# Patient Record
Sex: Male | Born: 1965 | Hispanic: No | Marital: Married | Smoking: Never smoker
Health system: Southern US, Community
[De-identification: ages and names within clinical notes are randomized; demographics above are authoritative.]

## PROBLEM LIST (undated history)

## (undated) DIAGNOSIS — E109 Type 1 diabetes mellitus without complications: Secondary | ICD-10-CM

## (undated) DIAGNOSIS — F32A Depression, unspecified: Secondary | ICD-10-CM

## (undated) DIAGNOSIS — E291 Testicular hypofunction: Secondary | ICD-10-CM

## (undated) DIAGNOSIS — E785 Hyperlipidemia, unspecified: Secondary | ICD-10-CM

## (undated) DIAGNOSIS — F1010 Alcohol abuse, uncomplicated: Secondary | ICD-10-CM

## (undated) DIAGNOSIS — A630 Anogenital (venereal) warts: Secondary | ICD-10-CM

## (undated) DIAGNOSIS — F329 Major depressive disorder, single episode, unspecified: Secondary | ICD-10-CM

## (undated) HISTORY — DX: Type 1 diabetes mellitus without complications: E10.9

## (undated) HISTORY — PX: TIBIA FRACTURE SURGERY: SHX806

## (undated) HISTORY — PX: APPENDECTOMY: SHX54

## (undated) HISTORY — PX: WISDOM TOOTH EXTRACTION: SHX21

## (undated) HISTORY — DX: Anogenital (venereal) warts: A63.0

## (undated) HISTORY — DX: Testicular hypofunction: E29.1

## (undated) HISTORY — DX: Depression, unspecified: F32.A

## (undated) HISTORY — DX: Major depressive disorder, single episode, unspecified: F32.9

## (undated) HISTORY — DX: Hyperlipidemia, unspecified: E78.5

## (undated) HISTORY — DX: Alcohol abuse, uncomplicated: F10.10

---

## 1990-02-04 DIAGNOSIS — A630 Anogenital (venereal) warts: Secondary | ICD-10-CM

## 1990-02-04 HISTORY — DX: Anogenital (venereal) warts: A63.0

## 2004-06-03 ENCOUNTER — Inpatient Hospital Stay: Payer: Self-pay | Admitting: Internal Medicine

## 2006-07-12 IMAGING — CR DG CHEST 1V PORT
1 series · 1 of 1 positions shown · non-contrast
Comparison: none

REASON FOR EXAM: Shortness of breath
COMMENTS:

PROCEDURE:     DXR - DXR PORTABLE CHEST SINGLE VIEW  - June 04, 2004  [DATE]
RESULT:     Single portable chest shows the lungs to be clear and well
expanded without evidence of infiltrates, effusions or mass.

[view not recorded]
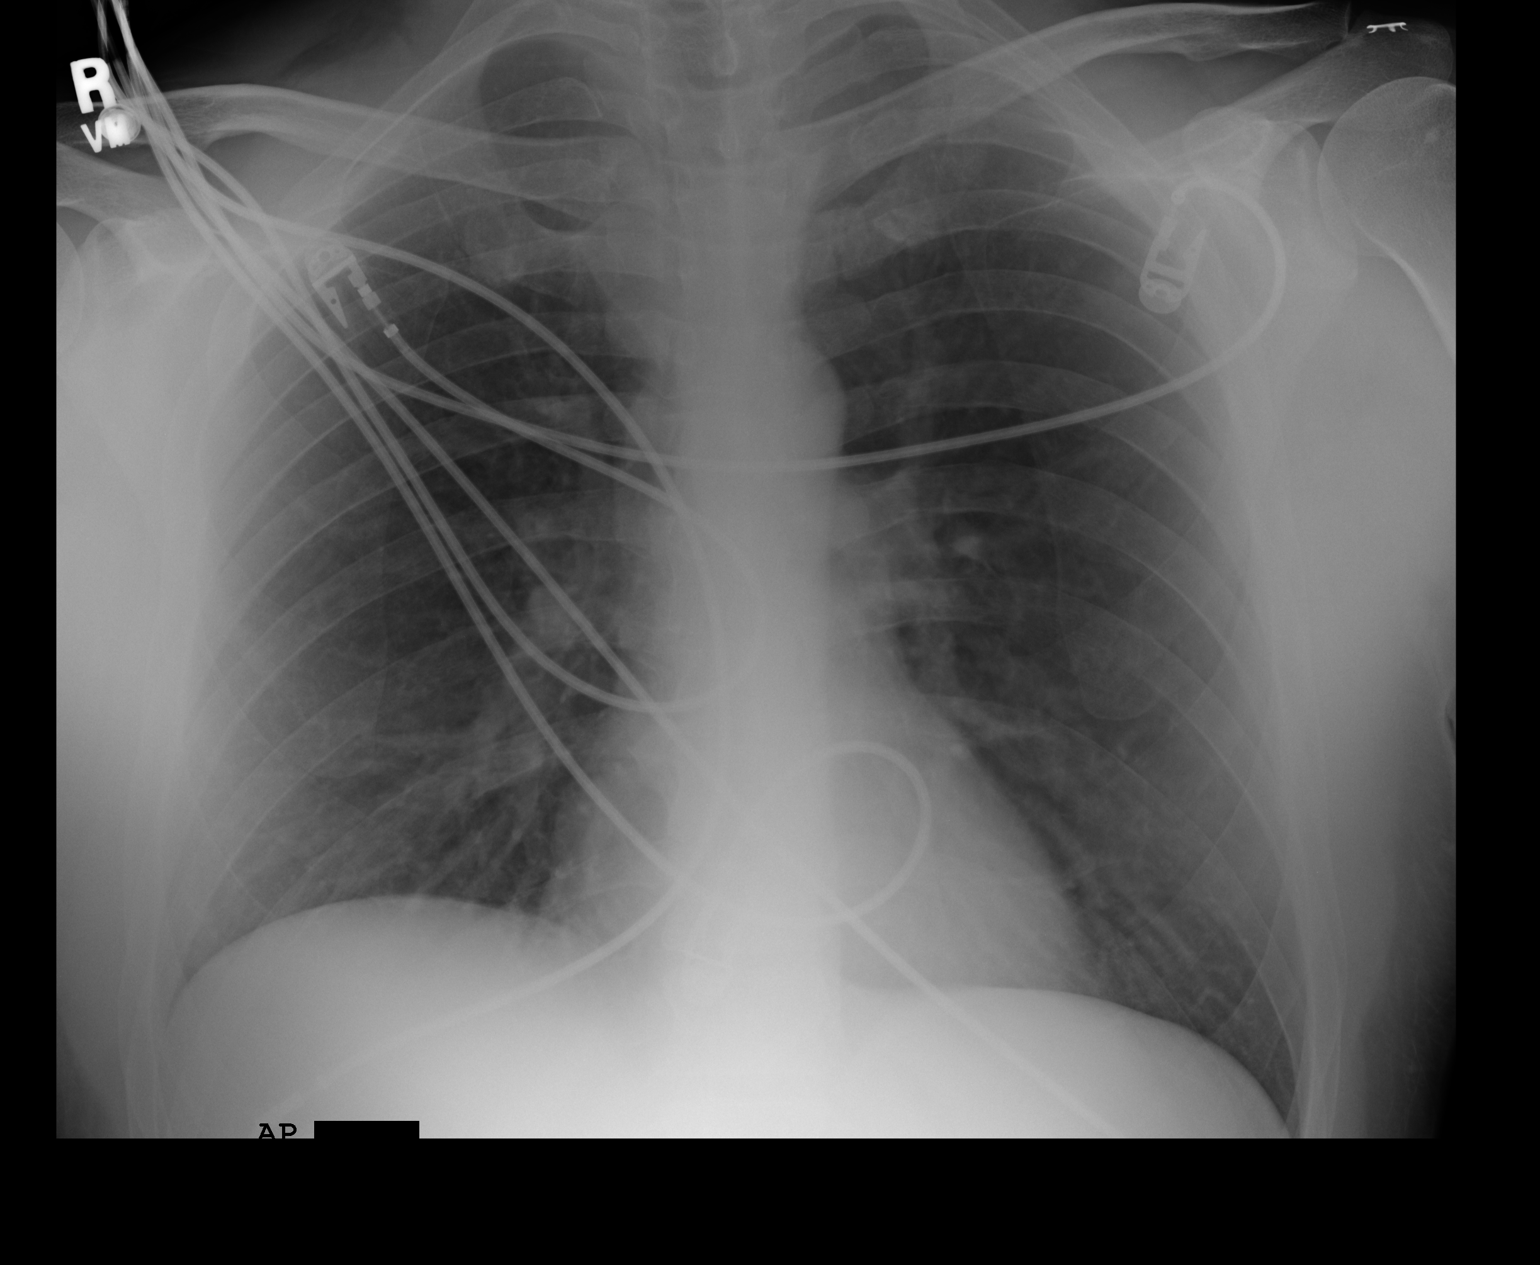

[1 of 1 positions shown; findings below may reference images not displayed]

IMPRESSION: No acute pulmonary disease.

## 2013-07-13 LAB — IFOBT (OCCULT BLOOD): IFOBT: POSITIVE

## 2013-07-15 ENCOUNTER — Encounter: Payer: Self-pay | Admitting: Gastroenterology

## 2013-07-27 ENCOUNTER — Telehealth: Payer: Self-pay | Admitting: *Deleted

## 2013-07-27 ENCOUNTER — Encounter: Payer: Self-pay | Admitting: Gastroenterology

## 2013-07-27 ENCOUNTER — Ambulatory Visit (INDEPENDENT_AMBULATORY_CARE_PROVIDER_SITE_OTHER): Payer: Managed Care, Other (non HMO) | Admitting: Gastroenterology

## 2013-07-27 VITALS — BP 130/72 | HR 100 | Ht 76.0 in | Wt 257.0 lb

## 2013-07-27 DIAGNOSIS — K625 Hemorrhage of anus and rectum: Secondary | ICD-10-CM

## 2013-07-27 DIAGNOSIS — R195 Other fecal abnormalities: Secondary | ICD-10-CM | POA: Insufficient documentation

## 2013-07-27 MED ORDER — NA SULFATE-K SULFATE-MG SULF 17.5-3.13-1.6 GM/177ML PO SOLN: ORAL | Status: DC

## 2013-07-27 NOTE — Patient Instructions (Signed)

## 2013-07-27 NOTE — Telephone Encounter (Signed)
Dear Dr. Jiles Prows Zehr, PA-C has scheduled the above patient for a colonoscopy at [facility] on 09-01-2013.  Our records show that he/she is on insulin therapy via an insulin pump.  Our colonoscopy prep protocol requires that:  the patient must be on a clear liquid diet the entire day prior to the procedure date as well as the morning of the procedure  the patient must be NPO for 3 hours prior to the procedure   the patient must consume a PEG 3350 solution to prepare for the procedure.  Please advise [place] of any adjustments that need to be made to the patient's insulin pump therapy prior to the above procedure date.    Please route or fax back this completed form to me at [phone].  If you have any question, please call me at [phone].  Thank you for your help with this matter.  Sincerely,  Hope Pigeon, [place]    Physician Recommendation:  ________________________________________________  ________________________________________________________________________  ________________________________________________________________________  ________________________________________________________________________

## 2013-07-27 NOTE — Progress Notes (Signed)
     07/27/2013 Paul Harrington [id number] 11-Mar-1965   HISTORY OF PRESENT ILLNESS:  This is a pleasant 48 year old male who is new to our practice. He presents to our office today at the request of his PCP, Dr. Reynaldo Minium, for recent positive Hemo-sure test, which was part of his routine physical exam.  He says that he does see some occasional bright red blood on the toilet paper when he wipes if he has a hard stool.  His bowel movements are not usually hard, however.  He has no other GI complaints.  No family history of colon cancer.  Recent labs including CBC, CMP, and TSH were WNL's.  Hgb was 15.4 grams.   Past Medical History  Diagnosis Date  . Diabetes mellitus type I   . Genital warts 1992  . Hyperlipemia   . Hypogonadism, male   . Depression   . Alcohol abuse    Past Surgical History  Procedure Laterality Date  . Appendectomy    . Wisdom tooth extraction    . Tibia fracture surgery      reports that he has never smoked. He has never used smokeless tobacco. He reports that he does not drink alcohol or use illicit drugs. family history includes Breast cancer in his mother; Diabetes in his sister. No Known Allergies    Outpatient Encounter Prescriptions as of 07/27/2013  Medication Sig  . ANDROGEL PUMP 20.25 MG/ACT (1.62%) GEL   . ARIPiprazole (ABILIFY) 2 MG tablet   . BAYER CONTOUR NEXT TEST test strip   . buPROPion (WELLBUTRIN XL) 150 MG 24 hr tablet   . FLUoxetine (PROZAC) 20 MG capsule   . Insulin Human (INSULIN PUMP) SOLN Inject into the skin as directed.  . simvastatin (ZOCOR) 20 MG tablet Take 20 mg by mouth daily.  . Na Sulfate-K Sulfate-Mg Sulf (SUPREP BOWEL PREP) SOLN USE PER PREP INSTRUCTIONS  . [DISCONTINUED] NOVOLOG 100 UNIT/ML injection      REVIEW OF SYSTEMS  : All other systems reviewed and negative except where noted in the History of Present Illness.   PHYSICAL EXAM: BP 130/72  Pulse 100  Ht 6\' 4"  (1.93 m)  Wt 257 lb (116.574 kg)  BMI 31.30  kg/m2 General: Well developed white male in no acute distress Head: Normocephalic and atraumatic Eyes:  Sclerae anicteric, conjunctiva pink. Ears: Normal auditory acuity  Lungs: Clear throughout to auscultation Heart: Regular rate and rhythm Abdomen: Soft, non-distended.  Normal bowel sounds.  Non-tender. Rectal:  Deferred.  Will be done at the time of colonoscopy. Musculoskeletal: Symmetrical with no gross deformities  Skin: No lesions on visible extremities Extremities: No edema  Neurological: Alert oriented x 4, grossly non-focal Psychological:  Alert and cooperative. Normal mood and affect  ASSESSMENT AND PLAN: -Rectal bleeding and heme positive stools:  Possibly hemorrhoidal/outlet bleeding.  Never had colonoscopy in the past.  Will schedule with Dr. Deatra Ina.  The risks, benefits, and alternatives were discussed with the patient and he consents to proceed.

## 2013-07-28 NOTE — Progress Notes (Signed)
Reviewed and agree with management. Robert D. Kaplan, M.D., FACG  

## 2013-08-02 ENCOUNTER — Encounter: Payer: Self-pay | Admitting: Gastroenterology

## 2013-08-03 NOTE — Telephone Encounter (Signed)
Received via fax letter from Dr. Reynaldo Minium about patient's insulin pump instructions. Copy made and sent to be scanned in chart.   I called Dr. Jacquiline Doe nurse to double check the insulin pump instructions. Dr. Reynaldo Minium and his nurse are out of the office until next week. Will call back next week.

## 2013-08-09 MED ORDER — NA SULFATE-K SULFATE-MG SULF 17.5-3.13-1.6 GM/177ML PO SOLN: ORAL | Status: DC

## 2013-08-09 NOTE — Telephone Encounter (Signed)
Called Dr. Jacquiline Doe office, was transferred to [place] for help deciphering the insulin pump letter. [place] transferred me to Dr. Jacquiline Doe nurse Danae Chen, had to leave a message on voice mail for call back.

## 2013-08-09 NOTE — Telephone Encounter (Signed)
Erica from Dr. Jacquiline Doe office called back. Per Danae Chen Dr. Reynaldo Minium wants patient to cut back to 1.0 starting 11 pm night before procedure, no bolus and continue 1.0 until after procedure. I called patient and gave Dr. Jacquiline Doe instructions about insulin pump. Patient verbalized understanding. I advised patient if he has further questions about his insulin pump to contact Dr. Jacquiline Doe office. Patient verbalized understanding.

## 2013-09-01 ENCOUNTER — Encounter: Payer: Self-pay | Admitting: Gastroenterology

## 2013-09-01 ENCOUNTER — Ambulatory Visit (AMBULATORY_SURGERY_CENTER): Payer: Managed Care, Other (non HMO) | Admitting: Gastroenterology

## 2013-09-01 VITALS — BP 116/59 | HR 71 | Temp 96.6°F | Resp 16 | Ht 76.0 in | Wt 257.0 lb

## 2013-09-01 DIAGNOSIS — D126 Benign neoplasm of colon, unspecified: Secondary | ICD-10-CM

## 2013-09-01 DIAGNOSIS — K625 Hemorrhage of anus and rectum: Secondary | ICD-10-CM

## 2013-09-01 MED ORDER — SODIUM CHLORIDE 0.9 % IV SOLN: 500.0000 mL | INTRAVENOUS | Status: DC

## 2013-09-01 NOTE — Patient Instructions (Signed)
YOU HAD AN ENDOSCOPIC PROCEDURE TODAY AT THE [facility]: Refer to the procedure report that was given to you for any specific questions about what was found during the examination.  If the procedure report does not answer your questions, please call your gastroenterologist to clarify.  If you requested that your care partner not be given the details of your procedure findings, then the procedure report has been included in a sealed envelope for you to review at your convenience later.  YOU SHOULD EXPECT: Some feelings of bloating in the abdomen. Passage of more gas than usual.  Walking can help get rid of the air that was put into your GI tract during the procedure and reduce the bloating. If you had a lower endoscopy (such as a colonoscopy or flexible sigmoidoscopy) you may notice spotting of blood in your stool or on the toilet paper. If you underwent a bowel prep for your procedure, then you may not have a normal bowel movement for a few days.  DIET: Your first meal following the procedure should be a light meal and then it is ok to progress to your normal diet.  A half-sandwich or bowl of soup is an example of a good first meal.  Heavy or fried foods are harder to digest and may make you feel nauseous or bloated.  Likewise meals heavy in dairy and vegetables can cause extra gas to form and this can also increase the bloating.  Drink plenty of fluids but you should avoid alcoholic beverages for 24 hours.  ACTIVITY: Your care partner should take you home directly after the procedure.  You should plan to take it easy, moving slowly for the rest of the day.  You can resume normal activity the day after the procedure however you should NOT DRIVE or use heavy machinery for 24 hours (because of the sedation medicines used during the test).    SYMPTOMS TO REPORT IMMEDIATELY: A gastroenterologist can be reached at any hour.  During normal business hours, 8:30 AM to 5:00 PM Monday through Friday,  call [phone].  After hours and on weekends, please call the GI answering service at [phone] who will take a message and have the physician on call contact you.   Following lower endoscopy (colonoscopy or flexible sigmoidoscopy):  Excessive amounts of blood in the stool  Significant tenderness or worsening of abdominal pains  Swelling of the abdomen that is new, acute  Fever of 100F or higher  FOLLOW UP: If any biopsies were taken you will be contacted by phone or by letter within the next 1-3 weeks.  Call your gastroenterologist if you have not heard about the biopsies in 3 weeks.  Our staff will call the home number listed on your records the next business day following your procedure to check on you and address any questions or concerns that you may have at that time regarding the information given to you following your procedure. This is a courtesy call and so if there is no answer at the home number and we have not heard from you through the emergency physician on call, we will assume that you have returned to your regular daily activities without incident.  SIGNATURES/CONFIDENTIALITY: You and/or your care partner have signed paperwork which will be entered into your electronic medical record.  These signatures attest to the fact that that the information above on your After Visit Summary has been reviewed and is understood.  Full responsibility of the confidentiality of this   discharge information lies with you and/or your care-partner.  Polyp-handout given  Repeat colonoscopy will be determined by pathology.  Hemoccults in 2 weeks.-cards given

## 2013-09-01 NOTE — Progress Notes (Signed)
  [place] [facility] Post-op Note  Patient: Paul Harrington  Procedure(s) Performed: colonoscopy  Patient Location: [facility] - Recovery Area  Anesthesia Type: Deep Sedation/Propofol  Level of Consciousness: awake, oriented and patient cooperative  Airway and Oxygen Therapy: Patient Spontanous Breathing  Post-op Pain: none  Post-op Assessment:  Post-op Vital signs reviewed, Patient's Cardiovascular Status Stable, Respiratory Function Stable, Patent Airway, No signs of Nausea or vomiting and Pain level controlled  Post-op Vital Signs: Reviewed and stable  Complications: No apparent anesthesia complications  Porshia Blizzard E 3:03 PM

## 2013-09-01 NOTE — Op Note (Signed)
[place]  [address] [place], [id number]   COLONOSCOPY PROCEDURE REPORT  PATIENT: Paul Harrington, Paul Harrington  MR#: [id number] BIRTHDATE: 06-Mar-1965 , 84  yrs. old GENDER: Male ENDOSCOPIST: Inda Castle, MD REFERRED VZ:SMOLMBE Reynaldo Minium, M.D. PROCEDURE DATE:  09/01/2013 PROCEDURE:   Colonoscopy with cold biopsy polypectomy First Screening Colonoscopy - Avg.  risk and is 50 yrs.  old or older Yes.  Prior Negative Screening - Now for repeat screening. N/A  History of Adenoma - Now for follow-up colonoscopy & has been > or = to 3 yrs.  N/A  Polyps Removed Today? Yes. ASA CLASS:   Class II INDICATIONS:occult blood . MEDICATIONS: MAC sedation, administered by CRNA and propofol (Diprivan) 300mg  IV  DESCRIPTION OF PROCEDURE:   After the risks benefits and alternatives of the procedure were thoroughly explained, informed consent was obtained.  A digital rectal exam revealed no abnormalities of the rectum.   The [id number]  endoscope was introduced through the anus and advanced to the cecum, which was identified by both the appendix and ileocecal valve. No adverse events experienced.   The quality of the prep was excellent using Suprep  The instrument was then slowly withdrawn as the colon was fully examined.      COLON FINDINGS: A sessile polyp measuring 2 mm in size was found in the ascending colon.  A polypectomy was performed with cold forceps.   The colon was otherwise normal.  There was no diverticulosis, inflammation, polyps or cancers unless previously stated.  Retroflexed views revealed no abnormalities. The time to cecum=2 minutes 43 seconds.  Withdrawal time=8 minutes 21 seconds. The scope was withdrawn and the procedure completed. COMPLICATIONS: There were no complications.  ENDOSCOPIC IMPRESSION: 1.   Sessile polyp measuring 2 mm in size was found in the ascending colon; polypectomy was performed with cold forceps 2.   The colon was otherwise  normal  RECOMMENDATIONS: 1.  If the polyp(s) removed today are proven to be adenomatous (pre-cancerous) polyps, you will need a repeat colonoscopy in 5 years.  Otherwise you should continue to follow colorectal cancer screening guidelines for "routine risk" patients with colonoscopy in 10 years.  You will receive a letter within 1-2 weeks with the results of your biopsy as well as final recommendations.  Please call my office if you have not received a letter after 3 weeks. 2.  Followup hemeoccults in 2 weeks   eSigned:  Inda Castle, MD 09/01/2013 3:16 PM   cc:   PATIENT NAME:  Bravlio, Luca MR#: [id number]

## 2013-09-01 NOTE — Progress Notes (Signed)
Called to room to assist during endoscopic procedure.  Patient ID and intended procedure confirmed with present staff. Received instructions for my participation in the procedure from the performing physician.  

## 2013-09-02 ENCOUNTER — Telehealth: Payer: Self-pay

## 2013-09-02 NOTE — Telephone Encounter (Signed)
Left a message at [phone] for the pt to call us back if they have any questions or concerns. maw

## 2013-09-10 ENCOUNTER — Other Ambulatory Visit: Payer: Self-pay

## 2013-09-10 DIAGNOSIS — K921 Melena: Secondary | ICD-10-CM

## 2013-09-20 ENCOUNTER — Encounter: Payer: Self-pay | Admitting: Gastroenterology

## 2015-11-02 ENCOUNTER — Encounter: Payer: Self-pay | Admitting: Podiatry

## 2015-11-02 ENCOUNTER — Ambulatory Visit (INDEPENDENT_AMBULATORY_CARE_PROVIDER_SITE_OTHER): Payer: Managed Care, Other (non HMO)

## 2015-11-02 ENCOUNTER — Ambulatory Visit (INDEPENDENT_AMBULATORY_CARE_PROVIDER_SITE_OTHER): Payer: Managed Care, Other (non HMO) | Admitting: Podiatry

## 2015-11-02 VITALS — BP 130/92 | HR 89 | Resp 16 | Ht 76.0 in | Wt 250.0 lb

## 2015-11-02 DIAGNOSIS — M722 Plantar fascial fibromatosis: Secondary | ICD-10-CM | POA: Diagnosis not present

## 2015-11-02 DIAGNOSIS — M79671 Pain in right foot: Secondary | ICD-10-CM

## 2015-11-02 MED ORDER — DICLOFENAC SODIUM 75 MG PO TBEC: 75.0000 mg | DELAYED_RELEASE_TABLET | Freq: Two times a day (BID) | ORAL | 2 refills | Status: DC

## 2015-11-02 MED ORDER — TRIAMCINOLONE ACETONIDE 10 MG/ML IJ SUSP
10.0000 mg | Freq: Once | INTRAMUSCULAR | Status: AC
  Administered 2015-11-02: 10 mg

## 2015-11-02 NOTE — Progress Notes (Signed)
   Subjective:    Patient ID: Paul Harrington, male    DOB: 09/21/1965, 50 y.o.   MRN: [id number]  HPI Chief Complaint  Patient presents with  . Foot Pain    Right foot; heel; pt stated, "Pain is on and off for the past 3 months; hurts in the morning and hurts after walking; Today does not have any pain"; pt Diabetic Type 1; Sugar=189 this am; A1C=did not know      Review of Systems  Musculoskeletal: Positive for gait problem.  All other systems reviewed and are negative.      Objective:   Physical Exam        Assessment & Plan:

## 2015-11-02 NOTE — Patient Instructions (Signed)

## 2015-11-02 NOTE — Progress Notes (Signed)
Subjective:     Patient ID: Paul Harrington, male   DOB: Jun 15, 1965, 50 y.o.   MRN: [id number]  HPI patient presents with pain in the plantar aspect of the right heel at the insertional point tendon into the calcaneus with inflammation and fluid buildup noted of 3 months duration   Review of Systems  All other systems reviewed and are negative.      Objective:   Physical Exam  Constitutional: He is oriented to person, place, and time.  Cardiovascular: Intact distal pulses.   Musculoskeletal: Normal range of motion.  Neurological: He is oriented to person, place, and time.  Skin: Skin is warm.  Nursing note and vitals reviewed.  neurovascular status intact muscle strength adequate range of motion within normal limits with patient found to have pain in the right plantar fashion at the insertional point tendon into the calcaneus with inflammation and fluid around the medial band and moderate depression of the arch. 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 Gave instructions on physical therapy and reappoint for [place] to recheck with considerations long-term for orthotics depending on response  X-ray report indicated small spur with no indications of stress fracture

## 2015-11-09 ENCOUNTER — Ambulatory Visit: Payer: Managed Care, Other (non HMO) | Admitting: Podiatry

## 2018-09-23 ENCOUNTER — Encounter: Payer: Self-pay | Admitting: Gastroenterology
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
## 2023-03-04 ENCOUNTER — Ambulatory Visit (INDEPENDENT_AMBULATORY_CARE_PROVIDER_SITE_OTHER): Payer: 59

## 2023-03-04 ENCOUNTER — Ambulatory Visit: Payer: 59 | Attending: Physician Assistant | Admitting: Physician Assistant

## 2023-03-04 ENCOUNTER — Ambulatory Visit: Payer: 59

## 2023-03-04 ENCOUNTER — Encounter: Payer: Self-pay | Admitting: Physician Assistant

## 2023-03-04 VITALS — BP 127/83 | HR 80 | Resp 14 | Ht 75.75 in | Wt 250.0 lb

## 2023-03-04 DIAGNOSIS — T148XXA Other injury of unspecified body region, initial encounter: Secondary | ICD-10-CM

## 2023-03-04 DIAGNOSIS — M545 Low back pain, unspecified: Secondary | ICD-10-CM

## 2023-03-04 DIAGNOSIS — M2550 Pain in unspecified joint: Secondary | ICD-10-CM | POA: Diagnosis not present

## 2023-03-04 DIAGNOSIS — E291 Testicular hypofunction: Secondary | ICD-10-CM

## 2023-03-04 DIAGNOSIS — M25561 Pain in right knee: Secondary | ICD-10-CM | POA: Diagnosis not present

## 2023-03-04 DIAGNOSIS — M1120 Other chondrocalcinosis, unspecified site: Secondary | ICD-10-CM | POA: Diagnosis not present

## 2023-03-04 DIAGNOSIS — M79672 Pain in left foot: Secondary | ICD-10-CM

## 2023-03-04 DIAGNOSIS — Z8639 Personal history of other endocrine, nutritional and metabolic disease: Secondary | ICD-10-CM

## 2023-03-04 DIAGNOSIS — M2569 Stiffness of other specified joint, not elsewhere classified: Secondary | ICD-10-CM

## 2023-03-04 DIAGNOSIS — Z8659 Personal history of other mental and behavioral disorders: Secondary | ICD-10-CM

## 2023-03-04 DIAGNOSIS — G8929 Other chronic pain: Secondary | ICD-10-CM | POA: Diagnosis not present

## 2023-03-04 DIAGNOSIS — M25562 Pain in left knee: Secondary | ICD-10-CM

## 2023-03-04 DIAGNOSIS — R42 Dizziness and giddiness: Secondary | ICD-10-CM

## 2023-03-04 DIAGNOSIS — M722 Plantar fascial fibromatosis: Secondary | ICD-10-CM | POA: Diagnosis not present

## 2023-03-04 DIAGNOSIS — M79671 Pain in right foot: Secondary | ICD-10-CM

## 2023-03-04 DIAGNOSIS — Z5181 Encounter for therapeutic drug level monitoring: Secondary | ICD-10-CM

## 2023-03-04 DIAGNOSIS — R21 Rash and other nonspecific skin eruption: Secondary | ICD-10-CM

## 2023-03-04 NOTE — Addendum Note (Signed)
Addended by: Gearldine Bienenstock on: 03/04/2023 02:56 PM   Modules accepted: Orders

## 2023-03-13 LAB — COMPLETE METABOLIC PANEL WITH GFR
AG Ratio: 1.5 (calc) (ref 1.0–2.5)
ALT: 27 U/L (ref 9–46)
AST: 19 U/L (ref 10–35)
Albumin: 4.5 g/dL (ref 3.6–5.1)
Alkaline phosphatase (APISO): 53 U/L (ref 35–144)
BUN: 11 mg/dL (ref 7–25)
CO2: 27 mmol/L (ref 20–32)
Calcium: 10 mg/dL (ref 8.6–10.3)
Chloride: 105 mmol/L (ref 98–110)
Creat: 1.04 mg/dL (ref 0.70–1.30)
Globulin: 3 g/dL (ref 1.9–3.7)
Glucose, Bld: 125 mg/dL — ABNORMAL HIGH (ref 65–99)
Potassium: 4.2 mmol/L (ref 3.5–5.3)
Sodium: 142 mmol/L (ref 135–146)
Total Bilirubin: 0.6 mg/dL (ref 0.2–1.2)
Total Protein: 7.5 g/dL (ref 6.1–8.1)
eGFR: 84 mL/min/{1.73_m2} (ref >=60)

## 2023-03-13 LAB — CBC WITH DIFFERENTIAL/PLATELET
Absolute Lymphocytes: 1803 {cells}/uL (ref 850–3900)
Absolute Monocytes: 599 {cells}/uL (ref 200–950)
Basophils Absolute: 51 {cells}/uL (ref 0–200)
Basophils Relative: 0.7 %
Eosinophils Absolute: 110 {cells}/uL (ref 15–500)
Eosinophils Relative: 1.5 %
HCT: 41.3 % (ref 38.5–50.0)
Hemoglobin: 14.2 g/dL (ref 13.2–17.1)
MCH: 31.4 pg (ref 27.0–33.0)
MCHC: 34.4 g/dL (ref 32.0–36.0)
MCV: 91.4 fL (ref 80.0–100.0)
MPV: 9.6 fL (ref 7.5–12.5)
Monocytes Relative: 8.2 %
Neutro Abs: 4738 {cells}/uL (ref 1500–7800)
Neutrophils Relative %: 64.9 %
Platelets: 256 Thousand/uL (ref 140–400)
RBC: 4.52 Million/uL (ref 4.20–5.80)
RDW: 11.8 % (ref 11.0–15.0)
Total Lymphocyte: 24.7 %
WBC: 7.3 Thousand/uL (ref 3.8–10.8)

## 2023-03-13 LAB — URIC ACID: Uric Acid, Serum: 3.2 mg/dL — ABNORMAL LOW (ref 4.0–8.0)

## 2023-03-13 LAB — ANA: Anti Nuclear Antibody (ANA): POSITIVE — AB

## 2023-03-13 LAB — ANTI-NUCLEAR AB-TITER (ANA TITER)
ANA TITER: 1:80 {titer} — ABNORMAL HIGH
ANA Titer 1: 1:80 {titer} — ABNORMAL HIGH

## 2023-03-13 LAB — RHEUMATOID FACTOR: Rheumatoid fact SerPl-aCnc: <10 [IU]/mL

## 2023-03-13 LAB — CYCLIC CITRUL PEPTIDE ANTIBODY, IGG: Cyclic Citrullin Peptide Ab: <16 U

## 2023-03-13 LAB — C-REACTIVE PROTEIN: CRP: <3 mg/L (ref <8.0)

## 2023-03-13 LAB — HLA-B27 ANTIGEN: HLA-B27 Antigen: NEGATIVE

## 2023-03-13 LAB — 14-3-3 ETA PROTEIN: 14-3-3 eta Protein: <0.2 ng/mL

## 2023-03-13 LAB — SEDIMENTATION RATE: Sed Rate: 6 mm/h (ref 0–20)
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
## 2023-04-07 ENCOUNTER — Ambulatory Visit: Payer: 59 | Attending: Physician Assistant | Admitting: Physician Assistant

## 2023-04-07 ENCOUNTER — Encounter: Payer: Self-pay | Admitting: Physician Assistant

## 2023-04-07 VITALS — BP 118/78 | HR 78 | Resp 15 | Ht 76.0 in | Wt 261.2 lb

## 2023-04-07 DIAGNOSIS — M19071 Primary osteoarthritis, right ankle and foot: Secondary | ICD-10-CM | POA: Diagnosis not present

## 2023-04-07 DIAGNOSIS — M1120 Other chondrocalcinosis, unspecified site: Secondary | ICD-10-CM

## 2023-04-07 DIAGNOSIS — Z8659 Personal history of other mental and behavioral disorders: Secondary | ICD-10-CM

## 2023-04-07 DIAGNOSIS — M722 Plantar fascial fibromatosis: Secondary | ICD-10-CM

## 2023-04-07 DIAGNOSIS — Z8639 Personal history of other endocrine, nutritional and metabolic disease: Secondary | ICD-10-CM

## 2023-04-07 DIAGNOSIS — M545 Low back pain, unspecified: Secondary | ICD-10-CM

## 2023-04-07 DIAGNOSIS — R768 Other specified abnormal immunological findings in serum: Secondary | ICD-10-CM | POA: Diagnosis not present

## 2023-04-07 DIAGNOSIS — R21 Rash and other nonspecific skin eruption: Secondary | ICD-10-CM

## 2023-04-07 DIAGNOSIS — Z5181 Encounter for therapeutic drug level monitoring: Secondary | ICD-10-CM

## 2023-04-07 DIAGNOSIS — T148XXA Other injury of unspecified body region, initial encounter: Secondary | ICD-10-CM

## 2023-04-07 DIAGNOSIS — M19072 Primary osteoarthritis, left ankle and foot: Secondary | ICD-10-CM

## 2023-04-07 DIAGNOSIS — M170 Bilateral primary osteoarthritis of knee: Secondary | ICD-10-CM

## 2023-04-07 DIAGNOSIS — G8929 Other chronic pain: Secondary | ICD-10-CM

## 2023-04-07 DIAGNOSIS — E291 Testicular hypofunction: Secondary | ICD-10-CM

## 2023-04-07 MED ORDER — COLCHICINE 0.6 MG PO TABS
0.3000 mg | ORAL_TABLET | Freq: Every day | ORAL | 0 refills | Status: DC
Start: 2023-04-07 — End: 2023-05-20

## 2023-04-07 MED ORDER — MELOXICAM 15 MG PO TABS: 15.0000 mg | ORAL_TABLET | Freq: Every day | ORAL | 0 refills | Status: DC | PRN

## 2023-04-07 NOTE — Progress Notes (Signed)
 Counseled patient on the purpose proper use, and adverse effects of colchicine.  Reviewed importance of avoiding alcohol. Discussed possible side effects of nausea, diarrhea which are dose-related. Discussed possible for muscle weakness after long period of use.  Discussed importance of low purine diet - avoiding alcohol, shellfish, red meats, etc.  Provided patient with medication education material and answered all questions.    Reviewed drug-drug interaction with rosuvastatin which can increase risk of myopathy and rhabdomyolysis   Patient's dose will be: 0.3 mg once daily (due to drug interaction)  Chesley Mires, PharmD, MPH, BCPS, CPP Clinical Pharmacist (Rheumatology and Pulmonology)

## 2023-04-07 NOTE — Patient Instructions (Signed)
Colchicine Tablets What is this medication? COLCHICINE (KOL chi seen) prevents and treats gout attacks. It may also be used to treat familial Mediterranean fever (FMF). It works by decreasing inflammation and reducing the buildup of uric acid in your joints. This medicine may be used for other purposes; ask your health care provider or pharmacist if you have questions. COMMON BRAND NAME(S): Colcrys, LODOCO What should I tell my care team before I take this medication? They need to know if you have any of these conditions: Kidney disease Liver disease An unusual or allergic reaction to colchicine, other medications, foods, dyes, or preservatives Pregnant or trying to get pregnant Breast-feeding How should I use this medication? Take this medication by mouth with a full glass of water. Take it as directed on the prescription label at the same time every day. You can take it with or without food. If it upsets your stomach, take it with food. A special MedGuide will be given to you by the pharmacist with each prescription and refill. Be sure to read this information carefully each time. Talk to your care team about the use of this medication in children. While it may be prescribed for children as young as 4 years for selected conditions, precautions do apply. People 65 years and older may have a stronger reaction and need a smaller dose. Overdosage: If you think you have taken too much of this medicine contact a poison control center or emergency room at once. NOTE: This medicine is only for you. Do not share this medicine with others. What if I miss a dose? If you miss a dose, take it as soon as you can. If it is almost time for your next dose, take only that dose. Do not take double or extra doses. What may interact with this medication? Do not take this medication with any of the following: Certain antivirals for HIV or hepatitis This medication may also interact with the following: Certain  antibiotics, such as erythromycin or clarithromycin Certain medications for blood pressure, heart disease, irregular heartbeat Certain medications for cholesterol, such as atorvastatin, lovastatin, or simvastatin Certain medications for fungal infections, such as ketoconazole, itraconazole, or posaconazole Cyclosporine Grapefruit or grapefruit juice This list may not describe all possible interactions. Give your health care provider a list of all the medicines, herbs, non-prescription drugs, or dietary supplements you use. Also tell them if you smoke, drink alcohol, or use illegal drugs. Some items may interact with your medicine. What should I watch for while using this medication? Visit your care team for regular checks on your progress. Tell your care team if your symptoms do not start to get better or if they get worse. You should make sure you get enough vitamin B12 while you are taking this medication. Discuss the foods you eat and the vitamins you take with your care team. This medication may increase your risk to bruise or bleed. Call you care team if you notice any unusual bleeding. What side effects may I notice from receiving this medication? Side effects that you should report to your care team as soon as possible: Allergic reactions--skin rash, itching, hives, swelling of the face, lips, tongue, or throat Infection--fever, chills, cough, sore throat, wounds that don't heal, pain or trouble when passing urine, general feeling of discomfort or being unwell Muscle injury--unusual weakness or fatigue, muscle pain, dark yellow or brown urine, decrease in the amount of urine Pain, tingling, or numbness in the hands or feet Unusual bleeding or bruising  Side effects that usually do not require medical attention (report these to your care team if they continue or are bothersome): Diarrhea Nausea Vomiting This list may not describe all possible side effects. Call your doctor for medical  advice about side effects. You may report side effects to FDA at 1-800-FDA-1088. Where should I keep my medication? Keep out of the reach of children and pets. Store at room temperature between 15 and 30 degrees C (59 and 86 degrees F). Keep container tightly closed. Protect from light. Throw away any unused medication after the expiration date. NOTE: This sheet is a summary. It may not cover all possible information. If you have questions about this medicine, talk to your doctor, pharmacist, or health care provider.  2024 Elsevier/Gold Standard (2021-08-22 00:00:00)

## 2023-04-08 LAB — RNP ANTIBODY: Ribonucleic Protein(ENA) Antibody, IgG: <1 AI

## 2023-04-08 LAB — SEDIMENTATION RATE: Sed Rate: 2 mm/h (ref 0–20)

## 2023-04-08 LAB — PROTEIN / CREATININE RATIO, URINE
Creatinine, Urine: 160 mg/dL (ref 20–320)
Protein/Creat Ratio: 81 mg/g{creat} (ref 25–148)
Protein/Creatinine Ratio: 0.081 mg/mg{creat} (ref 0.025–0.148)
Total Protein, Urine: 13 mg/dL (ref 5–25)

## 2023-04-08 LAB — C3 AND C4
C3 Complement: 116 mg/dL (ref 82–185)
C4 Complement: 22 mg/dL (ref 15–53)

## 2023-04-08 LAB — ANTI-DNA ANTIBODY, DOUBLE-STRANDED: ds DNA Ab: 1 [IU]/mL

## 2023-04-08 LAB — ANTI-SMITH ANTIBODY: ENA SM Ab Ser-aCnc: <1 AI

## 2023-04-08 LAB — SJOGRENS SYNDROME-B EXTRACTABLE NUCLEAR ANTIBODY: SSB (La) (ENA) Antibody, IgG: <1 AI

## 2023-04-08 LAB — SJOGRENS SYNDROME-A EXTRACTABLE NUCLEAR ANTIBODY: SSA (Ro) (ENA) Antibody, IgG: <1 AI

## 2023-04-08 LAB — ANTI-SCLERODERMA ANTIBODY: Scleroderma (Scl-70) (ENA) Antibody, IgG: <1 AI

## 2023-04-08 NOTE — Progress Notes (Signed)
 ESR WNL  Urine protein creatinine ratio WNL

## 2023-04-09 NOTE — Progress Notes (Signed)
 dsDNA is negative  Complements WNL  Scl-70 and RNP negative.  [place] antibodies negative  Smith antibody negative  [employer]!!
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
## 2023-05-20 ENCOUNTER — Encounter: Payer: Self-pay | Admitting: Rheumatology

## 2023-05-20 ENCOUNTER — Ambulatory Visit: Attending: Rheumatology | Admitting: Rheumatology

## 2023-05-20 VITALS — BP 128/82 | HR 86 | Resp 15 | Ht 76.0 in | Wt 255.8 lb

## 2023-05-20 DIAGNOSIS — M19071 Primary osteoarthritis, right ankle and foot: Secondary | ICD-10-CM | POA: Diagnosis not present

## 2023-05-20 DIAGNOSIS — Z8639 Personal history of other endocrine, nutritional and metabolic disease: Secondary | ICD-10-CM

## 2023-05-20 DIAGNOSIS — M1120 Other chondrocalcinosis, unspecified site: Secondary | ICD-10-CM

## 2023-05-20 DIAGNOSIS — R21 Rash and other nonspecific skin eruption: Secondary | ICD-10-CM

## 2023-05-20 DIAGNOSIS — M170 Bilateral primary osteoarthritis of knee: Secondary | ICD-10-CM | POA: Diagnosis not present

## 2023-05-20 DIAGNOSIS — Z8659 Personal history of other mental and behavioral disorders: Secondary | ICD-10-CM

## 2023-05-20 DIAGNOSIS — Z5181 Encounter for therapeutic drug level monitoring: Secondary | ICD-10-CM

## 2023-05-20 DIAGNOSIS — R768 Other specified abnormal immunological findings in serum: Secondary | ICD-10-CM

## 2023-05-20 DIAGNOSIS — T148XXA Other injury of unspecified body region, initial encounter: Secondary | ICD-10-CM | POA: Insufficient documentation

## 2023-05-20 DIAGNOSIS — M25511 Pain in right shoulder: Secondary | ICD-10-CM | POA: Diagnosis not present

## 2023-05-20 DIAGNOSIS — M19072 Primary osteoarthritis, left ankle and foot: Secondary | ICD-10-CM

## 2023-05-20 DIAGNOSIS — E291 Testicular hypofunction: Secondary | ICD-10-CM | POA: Insufficient documentation

## 2023-05-20 DIAGNOSIS — E1065 Type 1 diabetes mellitus with hyperglycemia: Secondary | ICD-10-CM | POA: Insufficient documentation

## 2023-05-20 DIAGNOSIS — M722 Plantar fascial fibromatosis: Secondary | ICD-10-CM

## 2023-05-20 DIAGNOSIS — M25512 Pain in left shoulder: Secondary | ICD-10-CM

## 2023-05-20 DIAGNOSIS — E785 Hyperlipidemia, unspecified: Secondary | ICD-10-CM | POA: Insufficient documentation

## 2023-05-20 DIAGNOSIS — F3341 Major depressive disorder, recurrent, in partial remission: Secondary | ICD-10-CM | POA: Insufficient documentation

## 2023-05-20 DIAGNOSIS — G8929 Other chronic pain: Secondary | ICD-10-CM

## 2023-05-20 DIAGNOSIS — M545 Low back pain, unspecified: Secondary | ICD-10-CM

## 2023-05-20 MED ORDER — MELOXICAM 15 MG PO TABS: 15.0000 mg | ORAL_TABLET | Freq: Every day | ORAL | 0 refills | Status: AC | PRN

## 2023-05-20 NOTE — Patient Instructions (Addendum)
 Exercises for Chronic Knee Pain Chronic knee pain is pain that lasts longer than 3 months. For most people with chronic knee pain, exercise and weight loss is an important part of treatment. Your health care provider may want you to focus on: Making the muscles that support your knee stronger. This can take pressure off your knee and reduce pain. Preventing knee stiffness. How far you can move your knee, keeping it there or making it farther. Losing weight (if this applies) to take pressure off your knee, lower your risk for injury, and make it easier for you to exercise. Your provider will help you make an exercise program that fits your needs and physical abilities. Below are simple, low-impact exercises you can do at home. Ask your provider or physical therapist how often you should do your exercise program and how many times to repeat each exercise. General safety tips  Get your provider's approval before doing any exercises. Start slowly and stop any time you feel pain. Do not exercise if your knee pain is flaring up. Warm up first. Stretching a cold muscle can cause an injury. Do 5-10 minutes of easy movement or light stretching before beginning your exercises. Do 5-10 minutes of low-impact activity (like walking or cycling) before starting strengthening exercises. Contact your provider any time you have pain during or after exercising. Exercise can cause discomfort but should not be painful. It is normal to be a little stiff or sore after exercising. Stretching and range-of-motion exercises Front thigh stretch  Stand up straight and support your body by holding on to a chair or resting one hand on a wall. With your legs straight and close together, bend one knee to lift your heel up toward your butt. Using one hand for support, grab your ankle with your free hand. Pull your foot up closer toward your butt to feel the stretch in front of your thigh. Hold the stretch for 30  seconds. Repeat __________ times. Complete this exercise __________ times a day. Back thigh stretch  Sit on the floor with your back straight and your legs out straight in front of you. Place the palms of your hands on the floor and slide them toward your feet as you bend at the hip. Try to touch your nose to your knees and feel the stretch in the back of your thighs. Hold for 30 seconds. Repeat __________ times. Complete this exercise __________ times a day. Calf stretch  Stand facing a wall. Place the palms of your hands flat against the wall, arms extended, and lean slightly against the wall. Get into a lunge position with one leg bent at the knee and the other leg stretched out straight behind you. Keep both feet facing the wall and increase the bend in your knee while keeping the heel of the other leg flat on the ground. You should feel the stretch in your calf. Hold for 30 seconds. Repeat __________ times. Complete this exercise __________ times a day. Strengthening exercises Straight leg lift  Lie on your back with one knee bent and the other leg out straight. Slowly lift the straight leg without bending the knee. Lift until your foot is about 12 inches (30 cm) off the floor. Hold for 3-5 seconds and slowly lower your leg. Repeat __________ times. Complete this exercise __________ times a day. Single leg dip  Stand between two chairs and put both hands on the backs of the chairs for support. Extend one leg out straight with your body  weight resting on the heel of the standing leg. Slowly bend your standing knee to dip your body to the level that is comfortable for you. Hold for 3-5 seconds. Repeat __________ times. Complete this exercise __________ times a day. Hamstring curls  Stand straight, knees close together, facing the back of a chair. Hold on to the back of a chair with both hands. Keep one leg straight. Bend the other knee while bringing the heel up toward the butt  until the knee is bent at a 90-degree angle (right angle). Hold for 3-5 seconds. Repeat __________ times. Complete this exercise __________ times a day. Wall squat  Stand straight with your back, hips, and head against a wall. Step forward one foot at a time with your back still against the wall. Your feet should be 2 feet (61 cm) from the wall at shoulder width. Keeping your back, hips, and head against the wall, slide down the wall to as close to a sitting position as you can get. Hold for 5-10 seconds, then slowly slide back up. Repeat __________ times. Complete this exercise __________ times a day. Step-ups  Stand in front of a sturdy platform or stool that is about 6 inches (15 cm) high. Slowly step up with your left / right foot, keeping your knee in line with your hip and foot. Do not let your knee bend so far that you cannot see your toes. Hold on to a chair for balance, but do not use it for support. Slowly unlock your knee and lower yourself to the starting position. Repeat __________ times. Complete this exercise __________ times a day. Contact a health care provider if: Your exercises cause pain. Your pain is worse after you exercise. Your pain prevents you from doing your exercises. This information is not intended to replace advice given to you by your health care provider. Make sure you discuss any questions you have with your health care provider. Document Revised: 02/05/2022 Document Reviewed: 02/05/2022 [employer] Patient Education  2024 [employer]  Shoulder Exercises Ask your health care provider which exercises are safe for you. Do exercises exactly as told by your health care provider and adjust them as directed. It is normal to feel mild stretching, pulling, tightness, or discomfort as you do these exercises. Stop right away if you feel sudden pain or your pain gets worse. Do not begin these exercises until told by your health care provider. Stretching  exercises External rotation and abduction This exercise is sometimes called corner stretch. The exercise rotates your arm outward (external rotation) and moves your arm out from your body (abduction). Stand in a doorway with one of your feet slightly in front of the other. This is called a staggered stance. If you cannot reach your forearms to the door frame, stand facing a corner of a room. Choose one of the following positions as told by your health care provider: Place your hands and forearms on the door frame above your head. Place your hands and forearms on the door frame at the height of your head. Place your hands on the door frame at the height of your elbows. Slowly move your weight onto your front foot until you feel a stretch across your chest and in the front of your shoulders. Keep your head and chest upright and keep your abdominal muscles tight. Hold for __________ seconds. To release the stretch, shift your weight to your back foot. Repeat __________ times. Complete this exercise __________ times a day. Extension, standing  Stand and hold a broomstick, a cane, or a similar object behind your back. Your hands should be a little wider than shoulder-width apart. Your palms should face away from your back. Keeping your elbows straight and your shoulder muscles relaxed, move the stick away from your body until you feel a stretch in your shoulders (extension). Avoid shrugging your shoulders while you move the stick. Keep your shoulder blades tucked down toward the middle of your back. Hold for __________ seconds. Slowly return to the starting position. Repeat __________ times. Complete this exercise __________ times a day. Range-of-motion exercises Pendulum  Stand near a wall or a surface that you can hold onto for balance. Bend at the waist and let your left / right arm hang straight down. Use your other arm to support you. Keep your back straight and do not lock your  knees. Relax your left / right arm and shoulder muscles, and move your hips and your trunk so your left / right arm swings freely. Your arm should swing because of the motion of your body, not because you are using your arm or shoulder muscles. Keep moving your hips and trunk so your arm swings in the following directions, as told by your health care provider: Side to side. Forward and backward. In clockwise and counterclockwise circles. Continue each motion for __________ seconds, or for as long as told by your health care provider. Slowly return to the starting position. Repeat __________ times. Complete this exercise __________ times a day. Shoulder flexion, standing  Stand and hold a broomstick, a cane, or a similar object. Place your hands a little more than shoulder-width apart on the object. Your left / right hand should be palm-up, and your other hand should be palm-down. Keep your elbow straight and your shoulder muscles relaxed. Push the stick up with your healthy arm to raise your left / right arm in front of your body, and then over your head until you feel a stretch in your shoulder (flexion). Avoid shrugging your shoulder while you raise your arm. Keep your shoulder blade tucked down toward the middle of your back. Hold for __________ seconds. Slowly return to the starting position. Repeat __________ times. Complete this exercise __________ times a day. Shoulder abduction, standing  Stand and hold a broomstick, a cane, or a similar object. Place your hands a little more than shoulder-width apart on the object. Your left / right hand should be palm-up, and your other hand should be palm-down. Keep your elbow straight and your shoulder muscles relaxed. Push the object across your body toward your left / right side. Raise your left / right arm to the side of your body (abduction) until you feel a stretch in your shoulder. Do not raise your arm above shoulder height unless your health  care provider tells you to do that. If directed, raise your arm over your head. Avoid shrugging your shoulder while you raise your arm. Keep your shoulder blade tucked down toward the middle of your back. Hold for __________ seconds. Slowly return to the starting position. Repeat __________ times. Complete this exercise __________ times a day. Internal rotation  Place your left / right hand behind your back, palm-up. Use your other hand to dangle an exercise band, a broomstick, or a similar object over your shoulder. Grasp the band with your left / right hand so you are holding on to both ends. Gently pull up on the band until you feel a stretch in the front of your  left / right shoulder. The movement of your arm toward the center of your body is called internal rotation. Avoid shrugging your shoulder while you raise your arm. Keep your shoulder blade tucked down toward the middle of your back. Hold for __________ seconds. Release the stretch by letting go of the band and lowering your hands. Repeat __________ times. Complete this exercise __________ times a day. Strengthening exercises External rotation  Sit in a stable chair without armrests. Secure an exercise band to a stable object at elbow height on your left / right side. Place a soft object, such as a folded towel or a small pillow, between your left / right upper arm and your body to move your elbow about 4 inches (10 cm) away from your side. Hold the end of the exercise band so it is tight and there is no slack. Keeping your elbow pressed against the soft object, slowly move your forearm out, away from your abdomen (external rotation). Keep your body steady so only your forearm moves. Hold for __________ seconds. Slowly return to the starting position. Repeat __________ times. Complete this exercise __________ times a day. Shoulder abduction  Sit in a stable chair without armrests, or stand up. Hold a __________ lb / kg weight  in your left / right hand, or hold an exercise band with both hands. Start with your arms straight down and your left / right palm facing in, toward your body. Slowly lift your left / right hand out to your side (abduction). Do not lift your hand above shoulder height unless your health care provider tells you that this is safe. Keep your arms straight. Avoid shrugging your shoulder while you do this movement. Keep your shoulder blade tucked down toward the middle of your back. Hold for __________ seconds. Slowly lower your arm, and return to the starting position. Repeat __________ times. Complete this exercise __________ times a day. Shoulder extension  Sit in a stable chair without armrests, or stand up. Secure an exercise band to a stable object in front of you so it is at shoulder height. Hold one end of the exercise band in each hand. Straighten your elbows and lift your hands up to shoulder height. Squeeze your shoulder blades together as you pull your hands down to the sides of your thighs (extension). Stop when your hands are straight down by your sides. Do not let your hands go behind your body. Hold for __________ seconds. Slowly return to the starting position. Repeat __________ times. Complete this exercise __________ times a day. Shoulder row  Sit in a stable chair without armrests, or stand up. Secure an exercise band to a stable object in front of you so it is at chest height. Hold one end of the exercise band in each hand. Position your palms so that your thumbs are facing the ceiling (neutral position). Bend each of your elbows to a 90-degree angle (right angle) and keep your upper arms at your sides. Step back or move the chair back until the band is tight and there is no slack. Slowly pull your elbows back behind you. Hold for __________ seconds. Slowly return to the starting position. Repeat __________ times. Complete this exercise __________ times a day. Shoulder  press-ups  Sit in a stable chair that has armrests. Sit upright, with your feet flat on the floor. Put your hands on the armrests so your elbows are bent and your fingers are pointing forward. Your hands should be about even with the  sides of your body. Push down on the armrests and use your arms to lift yourself off the chair. Straighten your elbows and lift yourself up as much as you comfortably can. Move your shoulder blades down, and avoid letting your shoulders move up toward your ears. Keep your feet on the ground. As you get stronger, your feet should support less of your body weight as you lift yourself up. Hold for __________ seconds. Slowly lower yourself back into the chair. Repeat __________ times. Complete this exercise __________ times a day. Wall push-ups  Stand so you are facing a stable wall. Your feet should be about one arm-length away from the wall. Lean forward and place your palms on the wall at shoulder height. Keep your feet flat on the floor as you bend your elbows and lean forward toward the wall. Hold for __________ seconds. Straighten your elbows to push yourself back to the starting position. Repeat __________ times. Complete this exercise __________ times a day. This information is not intended to replace advice given to you by your health care provider. Make sure you discuss any questions you have with your health care provider. Document Revised: 03/13/2021 Document Reviewed: 03/13/2021 [employer] Patient Education  2024 [employer]  Low Back Sprain or Strain Rehab Ask your health care provider which exercises are safe for you. Do exercises exactly as told by your health care provider and adjust them as directed. It is normal to feel mild stretching, pulling, tightness, or discomfort as you do these exercises. Stop right away if you feel sudden pain or your pain gets worse. Do not begin these exercises until told by your health care provider. Stretching and  range-of-motion exercises These exercises warm up your muscles and joints and improve the movement and flexibility of your back. These exercises also help to relieve pain, numbness, and tingling. Lumbar rotation  Lie on your back on a firm bed or the floor with your knees bent. Straighten your arms out to your sides so each arm forms a 90-degree angle (right angle) with a side of your body. Slowly move (rotate) both of your knees to one side of your body until you feel a stretch in your lower back (lumbar). Try not to let your shoulders lift off the floor. Hold this position for __________ seconds. Tense your abdominal muscles and slowly move your knees back to the starting position. Repeat this exercise on the other side of your body. Repeat __________ times. Complete this exercise __________ times a day. Single knee to chest  Lie on your back on a firm bed or the floor with both legs straight. Bend one of your knees. Use your hands to move your knee up toward your chest until you feel a gentle stretch in your lower back and buttock. Hold your leg in this position by holding on to the front of your knee. Keep your other leg as straight as possible. Hold this position for __________ seconds. Slowly return to the starting position. Repeat with your other leg. Repeat __________ times. Complete this exercise __________ times a day. Prone extension on elbows  Lie on your abdomen on a firm bed or the floor (prone position). Prop yourself up on your elbows. Use your arms to help lift your chest up until you feel a gentle stretch in your abdomen and your lower back. This will place some of your body weight on your elbows. If this is uncomfortable, try stacking pillows under your chest. Your hips should stay down,  against the surface that you are lying on. Keep your hip and back muscles relaxed. Hold this position for __________ seconds. Slowly relax your upper body and return to the starting  position. Repeat __________ times. Complete this exercise __________ times a day. Strengthening exercises These exercises build strength and endurance in your back. Endurance is the ability to use your muscles for a long time, even after they get tired. Pelvic tilt This exercise strengthens the muscles that lie deep in the abdomen. Lie on your back on a firm bed or the floor with your legs extended. Bend your knees so they are pointing toward the ceiling and your feet are flat on the floor. Tighten your lower abdominal muscles to press your lower back against the floor. This motion will tilt your pelvis so your tailbone points up toward the ceiling instead of pointing to your feet or the floor. To help with this exercise, you may place a small towel under your lower back and try to push your back into the towel. Hold this position for __________ seconds. Let your muscles relax completely before you repeat this exercise. Repeat __________ times. Complete this exercise __________ times a day. Alternating arm and leg raises  Get on your hands and knees on a firm surface. If you are on a hard floor, you may want to use padding, such as an exercise mat, to cushion your knees. Line up your arms and legs. Your hands should be directly below your shoulders, and your knees should be directly below your hips. Lift your left leg behind you. At the same time, raise your right arm and straighten it in front of you. Do not lift your leg higher than your hip. Do not lift your arm higher than your shoulder. Keep your abdominal and back muscles tight. Keep your hips facing the ground. Do not arch your back. Keep your balance carefully, and do not hold your breath. Hold this position for __________ seconds. Slowly return to the starting position. Repeat with your right leg and your left arm. Repeat __________ times. Complete this exercise __________ times a day. Abdominal set with straight leg raise  Lie  on your back on a firm bed or the floor. Bend one of your knees and keep your other leg straight. Tense your abdominal muscles and lift your straight leg up, 4-6 inches (10-15 cm) off the ground. Keep your abdominal muscles tight and hold this position for __________ seconds. Do not hold your breath. Do not arch your back. Keep it flat against the ground. Keep your abdominal muscles tense as you slowly lower your leg back to the starting position. Repeat with your other leg. Repeat __________ times. Complete this exercise __________ times a day. Single leg lower with bent knees Lie on your back on a firm bed or the floor. Tense your abdominal muscles and lift your feet off the floor, one foot at a time, so your knees and hips are bent in 90-degree angles (right angles). Your knees should be over your hips and your lower legs should be parallel to the floor. Keeping your abdominal muscles tense and your knee bent, slowly lower one of your legs so your toe touches the ground. Lift your leg back up to return to the starting position. Do not hold your breath. Do not let your back arch. Keep your back flat against the ground. Repeat with your other leg. Repeat __________ times. Complete this exercise __________ times a day. Posture and body mechanics Good  posture and healthy body mechanics can help to relieve stress in your body's tissues and joints. Body mechanics refers to the movements and positions of your body while you do your daily activities. Posture is part of body mechanics. Good posture means: Your spine is in its natural S-curve position (neutral). Your shoulders are pulled back slightly. Your head is not tipped forward (neutral). Follow these guidelines to improve your posture and body mechanics in your everyday activities. Standing  When standing, keep your spine neutral and your feet about hip-width apart. Keep a slight bend in your knees. Your ears, shoulders, and hips should  line up. When you do a task in which you stand in one place for a long time, place one foot up on a stable object that is 2-4 inches (5-10 cm) high, such as a footstool. This helps keep your spine neutral. Sitting  When sitting, keep your spine neutral and keep your feet flat on the floor. Use a footrest, if necessary, and keep your thighs parallel to the floor. Avoid rounding your shoulders, and avoid tilting your head forward. When working at a desk or a computer, keep your desk at a height where your hands are slightly lower than your elbows. Slide your chair under your desk so you are close enough to maintain good posture. When working at a computer, place your monitor at a height where you are looking straight ahead and you do not have to tilt your head forward or downward to look at the screen. Resting When lying down and resting, avoid positions that are most painful for you. If you have pain with activities such as sitting, bending, stooping, or squatting, lie in a position in which your body does not bend very much. For example, avoid curling up on your side with your arms and knees near your chest (fetal position). If you have pain with activities such as standing for a long time or reaching with your arms, lie with your spine in a neutral position and bend your knees slightly. Try the following positions: Lying on your side with a pillow between your knees. Lying on your back with a pillow under your knees. Lifting  When lifting objects, keep your feet at least shoulder-width apart and tighten your abdominal muscles. Bend your knees and hips and keep your spine neutral. It is important to lift using the strength of your legs, not your back. Do not lock your knees straight out. Always ask for help to lift heavy or awkward objects. This information is not intended to replace advice given to you by your health care provider. Make sure you discuss any questions you have with your health care  provider. Document Revised: 05/27/2022 Document Reviewed: 04/10/2020 [employer] Patient Education  2024 [employer] Osteoarthritis  Osteoarthritis is a type of arthritis. It refers to joint pain or joint disease. Osteoarthritis affects tissue that covers the ends of bones in joints (cartilage). Cartilage acts as a cushion between the bones and helps them move smoothly. Osteoarthritis occurs when cartilage in the joints gets worn down. Osteoarthritis is sometimes called "wear and tear" arthritis. Osteoarthritis is the most common form of arthritis. It often occurs in older people. It is a condition that gets worse over time. The joints most often affected by this condition are in the fingers, toes, hips, knees, and spine, including the neck and lower back. What are the causes? This condition is caused by the wearing down of cartilage that covers the ends of bones. What  increases the risk? The following factors may make you more likely to develop this condition: Being age 56 or older. Obesity. Overuse of joints. Past injury of a joint. Past surgery on a joint. Family history of osteoarthritis. What are the signs or symptoms? The main symptoms of this condition are pain, swelling, and stiffness in the joint. Other symptoms may include: An enlarged joint. More pain and further damage caused by small pieces of bone or cartilage that break off and float inside of the joint. Small deposits of bone (osteophytes) that grow on the edges of the joint. A grating or scraping feeling inside the joint when you move it. Popping or creaking sounds when you move. Difficulty walking or exercising. An inability to grip items, twist your hand, or control the movements of your hands and fingers. How is this diagnosed? This condition may be diagnosed based on: Your medical history. A physical exam. Your symptoms. X-rays of the affected joints. Blood tests to rule out other types of arthritis. How is this  treated? There is no cure for this condition, but treatment can help control pain and improve joint function. Treatment may include a combination of therapies, such as: Pain relief techniques, such as: Applying heat and cold to the joint. Massage. A form of talk therapy called cognitive behavioral therapy (CBT). This therapy helps you set goals and follow up on the changes that you make. Medicines for pain and inflammation. The medicines can be taken by mouth or applied to the skin. They include: NSAIDs, such as ibuprofen. Prescription medicines. Strong anti-inflammatory medicines (corticosteroids). Certain nutritional supplements. A prescribed exercise program. You may work with a physical therapist. Assistive devices, such as a brace, wrap, splint, specialized glove, or cane. A weight control plan. Surgery, such as: An osteotomy. This is done to reposition the bones and relieve pain or to remove loose pieces of bone and cartilage. Joint replacement surgery. You may need this surgery if you have advanced osteoarthritis. Follow these instructions at home: Activity Rest your affected joints as told by your health care provider. Exercise as told by your provider. The provider may recommend specific types of exercise, such as: Strengthening exercises. These are done to strengthen the muscles that support joints affected by arthritis. Aerobic activities. These are exercises, such as brisk walking or water aerobics, that increase your heart rate. Range-of-motion activities. These help your joints move more easily. Balance and agility exercises. Managing pain, stiffness, and swelling     If told, apply heat to the affected area as often as told by your provider. Use the heat source that your provider recommends, such as a moist heat pack or a heating pad. If you have a removable assistive device, remove it as told by your provider. Place a towel between your skin and the heat source. If your  provider tells you to keep the assistive device on while you apply heat, place a towel between the assistive device and the heat source. Leave the heat on for 20-30 minutes. If told, put ice on the affected area. If you have a removable assistive device, remove it as told by your provider. Put ice in a plastic bag. Place a towel between your skin and the bag. If your provider tells you to keep the assistive device on during icing, place a towel between the assistive device and the bag. Leave the ice on for 20 minutes, 2-3 times a day. If your skin turns bright red, remove the ice or heat right away  to prevent skin damage. The risk of damage is higher if you cannot feel pain, heat, or cold. Move your fingers or toes often to reduce stiffness and swelling. Raise (elevate) the affected area above the level of your heart while you are sitting or lying down. General instructions Take over-the-counter and prescription medicines only as told by your provider. Maintain a healthy weight. Follow instructions from your provider for weight control. Do not use any products that contain nicotine or tobacco. These products include cigarettes, chewing tobacco, and vaping devices, such as e-cigarettes. If you need help quitting, ask your provider. Use assistive devices as told by your provider. Where to find more information [employer] of Arthritis and Musculoskeletal and Skin Diseases: niams.http://www.myers.net/ [employer] on Aging: BaseRingTones.pl American College of Rheumatology: rheumatology.org Contact a health care provider if: You have redness, swelling, or a feeling of warmth in a joint that gets worse. You have a fever along with joint or muscle aches. You develop a rash. You have trouble doing your normal activities. You have pain that gets worse and is not relieved by pain medicine. This information is not intended to replace advice given to you by your health care provider. Make sure you discuss  any questions you have with your health care provider. Document Revised: 09/20/2021 Document Reviewed: 09/20/2021 Elsevier Patient Education  2024 [employer] Heart Disease Prevention   Your inflammatory disease increases your risk of heart disease which includes heart attack, stroke, atrial fibrillation (irregular heartbeats), high blood pressure, heart failure and atherosclerosis (plaque in the arteries).  It is important to reduce your risk by:   Keep blood pressure, cholesterol, and blood sugar at healthy levels   Smoking Cessation   Maintain a healthy weight  BMI 20-25   Eat a healthy diet  Plenty of fresh fruit, vegetables, and whole grains  Limit saturated fats, foods high in sodium, and added sugars  DASH and Mediterranean diet   Increase physical activity  Recommend moderate physically activity for 150 minutes per week/ 30 minutes a day for five days a week These can be broken up into three separate ten-minute sessions during the day.   Reduce Stress  Meditation, slow breathing exercises, yoga, coloring books  Dental visits twice a year    Meloxicam Tablets What is this medication? MELOXICAM (mel OX i cam) treats mild to moderate pain, inflammation, or arthritis. It works by decreasing inflammation. It belongs to a group of medications called NSAIDs. This medicine may be used for other purposes; ask your health care provider or pharmacist if you have questions. COMMON BRAND NAME(S): Mobic What should I tell my care team before I take this medication? They need to know if you have any of these conditions: Asthma (lung or breathing disease) Bleeding disorder Coronary artery bypass graft (CABG) within the past 2 weeks Dehydration Frequently drink alcohol Heart attack Heart disease Heart failure High blood pressure Kidney disease Liver disease Stomach bleeding Stomach ulcers, other stomach or intestine problems Take medications that treat or prevent blood  clots Taking other steroids, such as dexamethasone or prednisone Tobacco use An unusual or allergic reaction to meloxicam, other medications, foods, dyes, or preservatives Pregnant or trying to get pregnant Breast-feeding How should I use this medication? Take this medication by mouth. Take it as directed on the prescription label at the same time every day. You can take it with or without food. If it upsets your stomach, take it with food. Do not use it more often than  directed. There may be unused or extra doses in the bottle after you finish your treatment. Talk to your care team if you have questions about your dose. A special MedGuide will be given to you by the pharmacist with each prescription and refill. Be sure to read this information carefully each time. Talk to your care team about the use of this medication in children. Special care may be needed. People over 1 years of age may have a stronger reaction and need a smaller dose. Overdosage: If you think you have taken too much of this medicine contact a poison control center or emergency room at once. NOTE: This medicine is only for you. Do not share this medicine with others. What if I miss a dose? If you miss a dose, take it as soon as you can. If it is almost time for your next dose, take only that dose. Do not take double or extra doses. What may interact with this medication? Do not take this medication with any of the following: Cidofovir Ketorolac This medication may also interact with the following: Alcohol Aspirin and aspirin-like medications Certain medications for blood pressure, heart disease, irregular heart beat Certain medications for mental health conditions Certain medications that treat or prevent blood clots, such as warfarin, enoxaparin, dalteparin, apixaban, dabigatran, rivaroxaban Cyclosporine Diuretics Fluconazole Lithium Methotrexate Other NSAIDs, medications for pain and inflammation, such as  ibuprofen and naproxen Pemetrexed This list may not describe all possible interactions. Give your health care provider a list of all the medicines, herbs, non-prescription drugs, or dietary supplements you use. Also tell them if you smoke, drink alcohol, or use illegal drugs. Some items may interact with your medicine. What should I watch for while using this medication? Visit your care team for regular checks on your progress. Tell your care team if your symptoms do not start to get better or if they get worse. Do not take other medications that contain aspirin, ibuprofen, or naproxen with this medication. Side effects such as stomach upset, nausea, or ulcers may be more likely to occur. Many non-prescription medications contain aspirin, ibuprofen, or naproxen. Always read labels carefully. This medication can cause serious ulcers and bleeding in the stomach. It can happen with no warning. Tobacco, alcohol, older age, and poor health can also increase risks. Call your care team right away if you have stomach pain or blood in your vomit or stool. This medication does not prevent a heart attack or stroke. This medication may increase the chance of a heart attack or stroke. The chance may increase the longer you use this medication or if you have heart disease. If you take aspirin to prevent a heart attack or stroke, talk to your care team about using this medication. This medication may cause serious skin reactions. They can happen weeks to months after starting the medication. Contact your care team right away if you notice fevers or flu-like symptoms with a rash. The rash may be red or purple and then turn into blisters or peeling of the skin. Or, you might notice a red rash with swelling of the face, lips or lymph nodes in your neck or under your arms. Talk to your care team if you wish to become pregnant or think you might be pregnant. This medication can cause serious birth defects. This medication may  affect your coordination, reaction time, or judgment. Do not drive or operate machinery until you know how this medication affects you. Sit up or stand slowly to reduce  the risk of dizzy or fainting spells. Drinking alcohol with this medication can increase the risk of these side effects. Be careful brushing or flossing your teeth or using a toothpick because you may get an infection or bleed more easily. If you have any dental work done, tell your dentist you are receiving this medication. This medication may make it more difficult to get pregnant. Talk to your care team if you are concerned about your fertility. What side effects may I notice from receiving this medication? Side effects that you should report to your care team as soon as possible: Allergic reactions--skin rash, itching, hives, swelling of the face, lips, tongue, or throat Bleeding--bloody or black, tar-like stools, vomiting blood or brown material that looks like coffee grounds, red or dark brown urine, small red or purple spots on skin, unusual bruising or bleeding Heart attack--pain or tightness in the chest, shoulders, arms, or jaw, nausea, shortness of breath, cold or clammy skin, feeling faint or lightheaded Heart failure--shortness of breath, swelling of ankles, feet, or hands, sudden weight gain, unusual weakness or fatigue Increase in blood pressure Kidney injury--decrease in the amount of urine, swelling of the ankles, hands, or feet Liver injury--right upper belly pain, loss of appetite, nausea, light-colored stool, dark yellow or brown urine, yellowing skin or eyes, unusual weakness or fatigue Rash, fever, and swollen lymph nodes Redness, blistering, peeling, or loosening of the skin, including inside the mouth Stroke--sudden numbness or weakness of the face, arm, or leg, trouble speaking, confusion, trouble walking, loss of balance or coordination, dizziness, severe headache, change in vision Side effects that usually do  not require medical attention (report to your care team if they continue or are bothersome): Diarrhea Nausea Upset stomach This list may not describe all possible side effects. Call your doctor for medical advice about side effects. You may report side effects to FDA at 1-800-FDA-1088. Where should I keep my medication? Keep out of the reach of children and pets. Store at room temperature between 20 and 25 degrees C (68 and 77 degrees F). Protect from moisture. Keep the container tightly closed. Get rid of any unused medication after the expiration date. To get rid of medications that are no longer needed or have expired: Take the medication to a medication take-back program. Check with your pharmacy or law enforcement to find a location. If you cannot return the medication, check the label or package insert to see if the medication should be thrown out in the garbage or flushed down the toilet. If you are not sure, ask your care team. If it is safe to put it in the trash, empty the medication out of the container. Mix the medication with cat litter, dirt, coffee grounds, or other unwanted substance. Seal the mixture in a bag or container. Put it in the trash. NOTE: This sheet is a summary. It may not cover all possible information. If you have questions about this medicine, talk to your doctor, pharmacist, or health care provider.  2024 Elsevier/Gold Standard (2020-10-04 00:00:00)

## 2023-11-06 NOTE — Progress Notes (Deleted)
 Office Visit Note  Patient: Paul Harrington             Date of Birth: [birth year]           MRN: [id number]             PCP: Shepard Ade, MD Referring: Shepard Ade, MD Visit Date: 11/19/2023 Occupation: Data Unavailable  Subjective:  No chief complaint on file.   History of Present Illness: Paul Harrington is a 58 y.o. male ***     Activities of Daily Living:  Patient reports morning stiffness for *** {minute/hour:19697}.   Patient {ACTIONS;DENIES/REPORTS:21021675::Denies} nocturnal pain.  Difficulty dressing/grooming: {ACTIONS;DENIES/REPORTS:21021675::Denies} Difficulty climbing stairs: {ACTIONS;DENIES/REPORTS:21021675::Denies} Difficulty getting out of chair: {ACTIONS;DENIES/REPORTS:21021675::Denies} Difficulty using hands for taps, buttons, cutlery, and/or writing: {ACTIONS;DENIES/REPORTS:21021675::Denies}  No Rheumatology ROS completed.   PMFS History:  Patient Active Problem List   Diagnosis Date Noted   Primary osteoarthritis of both knees 05/20/2023   Primary osteoarthritis of both feet 05/20/2023   Compound fracture 05/20/2023   Type 1 diabetes mellitus with hyperglycemia (HCC) 05/20/2023   Hypogonadism in male 05/20/2023   Dyslipidemia 05/20/2023   Recurrent major depressive disorder, in partial remission 05/20/2023   Rectal bleeding 07/27/2013   Heme positive stool 07/27/2013    Past Medical History:  Diagnosis Date   Alcohol abuse    Depression    Diabetes mellitus type I (HCC)    Genital warts 1992   Hyperlipemia    Hypogonadism, male     Family History  Problem Relation Age of Onset   Breast cancer Mother    Diabetes Sister    Past Surgical History:  Procedure Laterality Date   APPENDECTOMY     TIBIA FRACTURE SURGERY     WISDOM TOOTH EXTRACTION     Social History   Tobacco Use   Smoking status: Never    Passive exposure: Never   Smokeless tobacco: Never  Vaping Use   Vaping status: Former  Substance Use Topics    Alcohol use: No   Drug use: No   Social History   Social History Narrative   Daily caffeine       There is no immunization history on file for this patient.   Objective: Vital Signs: There were no vitals taken for this visit.   Physical Exam   Musculoskeletal Exam: ***  CDAI Exam: CDAI Score: -- Patient Global: --; Provider Global: -- Swollen: --; Tender: -- Joint Exam 11/19/2023   No joint exam has been documented for this visit   There is currently no information documented on the homunculus. Go to the Rheumatology activity and complete the homunculus joint exam.  Investigation: No additional findings.  Imaging: No results found.  Recent Labs: Lab Results  Component Value Date   WBC 7.3 03/04/2023   HGB 14.2 03/04/2023   PLT 256 03/04/2023   NA 142 03/04/2023   K 4.2 03/04/2023   CL 105 03/04/2023   CO2 27 03/04/2023   GLUCOSE 125 (H) 03/04/2023   BUN 11 03/04/2023   CREATININE 1.04 03/04/2023   BILITOT 0.6 03/04/2023   AST 19 03/04/2023   ALT 27 03/04/2023   PROT 7.5 03/04/2023   CALCIUM 10.0 03/04/2023    Speciality Comments: No specialty comments available.  Procedures:  No procedures performed Allergies: Patient has no known allergies.   Assessment / Plan:     Visit Diagnoses: No diagnosis found.  Orders: No orders of the defined types were placed in this encounter.  No orders of  the defined types were placed in this encounter.   Face-to-face time spent with patient was *** minutes. Greater than 50% of time was spent in counseling and coordination of care.  Follow-Up Instructions: No follow-ups on file.   Daved JAYSON Gavel, CMA  Note - This record has been created using Animal nutritionist.  Chart creation errors have been sought, but may not always  have been located. Such creation errors do not reflect on  the standard of medical care.

## 2023-11-19 ENCOUNTER — Ambulatory Visit: Admitting: Rheumatology

## 2023-11-19 DIAGNOSIS — M170 Bilateral primary osteoarthritis of knee: Secondary | ICD-10-CM

## 2023-11-19 DIAGNOSIS — F3341 Major depressive disorder, recurrent, in partial remission: Secondary | ICD-10-CM

## 2023-11-19 DIAGNOSIS — Z5181 Encounter for therapeutic drug level monitoring: Secondary | ICD-10-CM

## 2023-11-19 DIAGNOSIS — G8929 Other chronic pain: Secondary | ICD-10-CM

## 2023-11-19 DIAGNOSIS — R7689 Other specified abnormal immunological findings in serum: Secondary | ICD-10-CM

## 2023-11-19 DIAGNOSIS — R21 Rash and other nonspecific skin eruption: Secondary | ICD-10-CM

## 2023-11-19 DIAGNOSIS — E1065 Type 1 diabetes mellitus with hyperglycemia: Secondary | ICD-10-CM

## 2023-11-19 DIAGNOSIS — M19071 Primary osteoarthritis, right ankle and foot: Secondary | ICD-10-CM

## 2023-11-19 DIAGNOSIS — E291 Testicular hypofunction: Secondary | ICD-10-CM

## 2023-11-19 DIAGNOSIS — M1120 Other chondrocalcinosis, unspecified site: Secondary | ICD-10-CM

## 2023-11-19 DIAGNOSIS — M722 Plantar fascial fibromatosis: Secondary | ICD-10-CM

## 2023-11-19 DIAGNOSIS — E785 Hyperlipidemia, unspecified: Secondary | ICD-10-CM

## 2023-11-19 DIAGNOSIS — T148XXA Other injury of unspecified body region, initial encounter: Secondary | ICD-10-CM

## 2023-12-27 ENCOUNTER — Emergency Department (HOSPITAL_BASED_OUTPATIENT_CLINIC_OR_DEPARTMENT_OTHER)

## 2023-12-27 ENCOUNTER — Emergency Department (HOSPITAL_BASED_OUTPATIENT_CLINIC_OR_DEPARTMENT_OTHER)
Admission: EM | Admit: 2023-12-27 | Discharge: 2023-12-27 | Disposition: A | Discharge status: Home / Self Care | Attending: Emergency Medicine | Admitting: Emergency Medicine

## 2023-12-27 ENCOUNTER — Encounter (HOSPITAL_BASED_OUTPATIENT_CLINIC_OR_DEPARTMENT_OTHER): Payer: Self-pay | Admitting: Emergency Medicine

## 2023-12-27 ENCOUNTER — Other Ambulatory Visit: Payer: Self-pay

## 2023-12-27 DIAGNOSIS — W269XXA Contact with unspecified sharp object(s), initial encounter: Secondary | ICD-10-CM | POA: Diagnosis not present

## 2023-12-27 DIAGNOSIS — S61210A Laceration without foreign body of right index finger without damage to nail, initial encounter: Secondary | ICD-10-CM | POA: Diagnosis present

## 2023-12-27 DIAGNOSIS — Z23 Encounter for immunization: Secondary | ICD-10-CM | POA: Insufficient documentation

## 2023-12-27 MED ORDER — LIDOCAINE-EPINEPHRINE 1 %-1:100000 IJ SOLN
10.0000 mL | Freq: Once | INTRAMUSCULAR | Status: AC
  Administered 2023-12-27: 10 mL
  Filled 2023-12-27: qty 1

## 2023-12-27 MED ORDER — TETANUS-DIPHTH-ACELL PERTUSSIS 5-2-15.5 LF-MCG/0.5 IM SUSP
0.5000 mL | Freq: Once | INTRAMUSCULAR | Status: AC
  Administered 2023-12-27: 0.5 mL via INTRAMUSCULAR
  Filled 2023-12-27: qty 0.5

## 2023-12-27 MED ORDER — BACITRACIN ZINC 500 UNIT/GM EX OINT: TOPICAL_OINTMENT | Freq: Two times a day (BID) | CUTANEOUS | Status: DC

## 2023-12-27 NOTE — ED Triage Notes (Signed)
 Pt c/o laceration to RT index finger today after lifting fridge.  Bleeding controlled, ~ 6cm lac noted to anterior finger

## 2023-12-27 NOTE — ED Notes (Signed)
 Pt d/c by Damien, RN

## 2023-12-27 NOTE — ED Provider Notes (Signed)
 [place] EMERGENCY DEPARTMENT AT [facility] Provider Note   CSN: [id number] Arrival date & time: 12/27/23  1247     Patient presents with: Extremity Laceration   Paul Harrington is a 58 y.o. male with past medical history of diabetes, lipidemia who presents emergency department for evaluation of a right index finger laceration.  Patient reports he was cleaning a dirty house and carrying a mini fridge with a friend, when his friend lost grip and the bottom middle piece of the fridge sliced his finger.  Patient states his last tetanus shot was around 10 years ago so he is requesting that be updated at this time.  He reports some mild pain around the area, but no additional symptoms.  Patient does not take a blood thinner.  HPI     Prior to Admission medications   Medication Sig Start Date End Date Taking? Authorizing Provider  ARIPiprazole (ABILIFY) 5 MG tablet Take 5 mg by mouth daily. 01/05/23   [provider]  BAYER CONTOUR NEXT TEST test strip  06/25/13   [provider]  buPROPion (WELLBUTRIN XL) 150 MG 24 hr tablet  07/25/13   [provider]  FLUoxetine (PROZAC) 20 MG capsule  07/25/13   [provider]  Insulin Human (INSULIN PUMP) SOLN Inject into the skin as directed.    [provider]  Lansoprazole (PREVACID PO) Take by mouth daily.    [provider]  meloxicam  (MOBIC ) 15 MG tablet Take 1 tablet (15 mg total) by mouth daily as needed for pain. 05/20/23   Dolphus Reiter, MD  NOVOLOG 100 UNIT/ML injection Inject into the skin. 02/25/23   [provider]  rosuvastatin (CRESTOR) 20 MG tablet Take 20 mg by mouth at bedtime. 01/01/23   [provider]    Allergies: Patient has no known allergies.    Review of Systems  Skin:  Positive for wound.    Updated Vital Signs BP (!) 146/102   Pulse 99   Temp 98 F (36.7 C)   Resp 16   Wt 113.4 kg   SpO2 99%   BMI 30.43 kg/m   Physical  Exam Vitals and nursing note reviewed.  Constitutional:      Appearance: Normal appearance. He is not ill-appearing.  Eyes:     General: No scleral icterus. Pulmonary:     Effort: Pulmonary effort is normal. No respiratory distress.  Musculoskeletal:        General: Signs of injury present. No deformity.     Comments: Approximately 5 cm right index finger laceration.  Wound is hemostatic and bleeding is controlled.  Distal radial pulses intact.  Patient able to move extremities without difficulty.  Strength and sensation are 5 out of 5 and intact.  FDS and FDP are intact.  Skin:    Coloration: Skin is not jaundiced.  Neurological:     General: No focal deficit present.     Mental Status: He is alert.  Psychiatric:        Mood and Affect: Mood normal.     (all labs ordered are listed, but only abnormal results are displayed) Labs Reviewed - No data to display  EKG: None  Radiology: DG Finger Index Right Result Date: 12/27/2023 CLINICAL DATA:  Trauma to the right index finger. EXAM: RIGHT INDEX FINGER 2+V COMPARISON:  None Available. FINDINGS: No acute fracture or dislocation. The bones are well mineralized. The soft tissues are unremarkable. Overlying dressing noted. IMPRESSION: Negative. Electronically Signed  By: Vanetta Chou M.D.   On: 12/27/2023 13:45    .Laceration Repair  Date/Time: 12/27/2023 3:17 PM  Performed by: Torrence Marry RAMAN, PA-C Authorized by: Torrence Marry RAMAN, PA-C   Consent:    Consent obtained:  Verbal   Consent given by:  Patient   Risks, benefits, and alternatives were discussed: yes     Risks discussed:  Infection, pain and retained foreign body   Alternatives discussed:  Delayed treatment Universal protocol:    Procedure explained and questions answered to patient or proxy's satisfaction: yes     Imaging studies available: yes     Patient identity confirmed:  Verbally with patient Anesthesia:    Anesthesia method:  Local  infiltration   Local anesthetic:  Lidocaine  1% WITH epi Laceration details:    Location:  Finger   Finger location:  R long finger   Length (cm):  5   Depth (mm):  1 Pre-procedure details:    Preparation:  Imaging obtained to evaluate for foreign bodies Exploration:    Limited defect created (wound extended): yes     Imaging obtained: x-ray     Imaging outcome: foreign body not noted     Wound exploration: wound explored through full range of motion     Contaminated: yes   Treatment:    Area cleansed with:  Saline   Amount of cleaning:  Extensive   Irrigation solution:  Sterile saline   Irrigation volume:    Irrigation method:  Pressure wash   Visualized foreign bodies/material removed: yes     Debridement:  Minimal   Undermining:  None   Scar revision: no   Skin repair:    Repair method:  Sutures   Suture size:  5-0   Suture material:  Prolene   Suture technique:  Simple interrupted   Number of sutures:  5 Approximation:    Approximation:  Close Repair type:    Repair type:  Simple Post-procedure details:    Dressing:  Antibiotic ointment and non-adherent dressing   Procedure completion:  Tolerated well, no immediate complications    Medications Ordered in the ED  bacitracin  ointment ( Topical Given 12/27/23 1507)  Tdap (ADACEL ) injection 0.5 mL (0.5 mLs Intramuscular Given 12/27/23 1401)  lidocaine -EPINEPHrine  (XYLOCAINE  W/EPI) 1 %-1:100000 (with pres) injection 10 mL (10 mLs Infiltration Given 12/27/23 1411)                                 Medical Decision Making Amount and/or Complexity of Data Reviewed Radiology: ordered.  Risk OTC drugs. Prescription drug management.   This patient presents to the ED for concern of right finger laceration, this involves an extensive number of treatment options, and is a complaint that carries with it a high risk of complications and morbidity.   Differential diagnosis includes: Infection, severed tendon, bleeding,  fracture, dislocation  Co morbidities:  type 1 diabetes   Lab Tests:  Not indicated  Imaging Studies:  I ordered imaging studies including right index finger x-ray I independently visualized and interpreted imaging which showed no evidence of fracture, dislocation or retained foreign body I agree with the radiologist interpretation  Cardiac Monitoring/ECG:  The patient was maintained on a cardiac monitor.  I personally viewed and interpreted the cardiac monitored which showed an underlying rhythm of: Sinus rhythm  Medicines ordered and prescription drug management:  I ordered medication including  Medications  bacitracin  ointment ( Topical  Given 12/27/23 1507)  Tdap (ADACEL ) injection 0.5 mL (0.5 mLs Intramuscular Given 12/27/23 1401)  lidocaine -EPINEPHrine  (XYLOCAINE  W/EPI) 1 %-1:100000 (with pres) injection 10 mL (10 mLs Infiltration Given 12/27/23 1411)   for tetanus booster and numbing for lack repair Reevaluation of the patient after these medicines showed that the patient stayed the same I have reviewed the patients home medicines and have made adjustments as needed  Test Considered:   none  Critical Interventions:   none  Consultations Obtained: None  Problem List / ED Course:     ICD-10-CM   1. Laceration of right index finger without foreign body without damage to nail, initial encounter  S61.210A       MDM: 58 year old male who presents emergency department for evaluation of right index finger laceration.  Patient was moving a mini fridge when his partner lost grip and the metal of the fridge sliced the patient's finger.  Wound is hemostatic at this time.  Patient's last tetanus was approximately 10 years ago so we will plan to repeat this toda  Right index finger x-ray is negative for fracture or dislocation.  Patient's FDS and FDP are intact.  No obvious tendon involvement.  Will plan to irrigate wound with normal saline, use lidocaine  as a numbing agent  and place sutures.  Please see procedure note for further detail.  Plan for patient to follow-up with his PCP for suture removal in approximately 7 to 10 days.    Dispostion:  After consideration of the diagnostic results and the patients response to treatment, I feel that the patient would benefit from ECP follow-up for suture removal in 1 week.    Final diagnoses:  Laceration of right index finger without foreign body without damage to nail, initial encounter    ED Discharge Orders     None          Torrence Marry RAMAN, PA-C 12/27/23 1754    Mannie Pac T, DO 12/28/23 [phone]

## 2023-12-27 NOTE — Discharge Instructions (Addendum)
 It was a pleasure taking care of you today. You were seen in the Emergency Department for a laceration to your right finger. Your work-up was reassuring. Your x-ray showed no evidence of dislocation, fracture or foreign body.  You will need to follow-up with your PCP or an urgent care to have your sutures removed in approximately 7 to 10 days.  Additionally, do your best to keep the wound clean with gentle soap and water.  We have also updated your tetanus shot today.  Your tetanus is good for up to 10 years. Refer to the attached documentation for further management of your symptoms.  Please return to the ER if you experience chest pain, trouble breathing, intractable nausea/vomiting or any other life threatening illnesses.
